# Patient Record
Sex: Male | Born: 1955 | Race: Black or African American | Hispanic: No | Marital: Single | Smoking: Current every day smoker
Health system: Southern US, Community
[De-identification: ages and names within clinical notes are randomized; demographics above are authoritative.]

## PROBLEM LIST (undated history)

## (undated) DIAGNOSIS — E785 Hyperlipidemia, unspecified: Secondary | ICD-10-CM

## (undated) HISTORY — PX: HERNIA REPAIR: SHX51

---

## 2014-08-17 ENCOUNTER — Emergency Department: Payer: Self-pay | Admitting: Emergency Medicine

## 2014-08-17 LAB — URINALYSIS, COMPLETE
BACTERIA: NONE SEEN
Bilirubin,UR: NEGATIVE
GLUCOSE, UR: NEGATIVE mg/dL (ref 0–75)
KETONE: NEGATIVE
Leukocyte Esterase: NEGATIVE
Nitrite: NEGATIVE
Ph: 5 (ref 4.5–8.0)
Protein: 30
RBC,UR: 89 /HPF (ref 0–5)
Specific Gravity: 1.027 (ref 1.003–1.030)

## 2014-08-17 LAB — CBC WITH DIFFERENTIAL/PLATELET
BASOS ABS: 0.1 10*3/uL (ref 0.0–0.1)
Basophil %: 0.6 %
Eosinophil #: 0.1 10*3/uL (ref 0.0–0.7)
Eosinophil %: 0.6 %
HCT: 44.9 % (ref 40.0–52.0)
HGB: 14.6 g/dL (ref 13.0–18.0)
LYMPHS ABS: 1.4 10*3/uL (ref 1.0–3.6)
LYMPHS PCT: 14.9 %
MCH: 30.5 pg (ref 26.0–34.0)
MCHC: 32.6 g/dL (ref 32.0–36.0)
MCV: 94 fL (ref 80–100)
Monocyte #: 0.8 x10 3/mm (ref 0.2–1.0)
Monocyte %: 8.6 %
Neutrophil #: 7.2 10*3/uL — ABNORMAL HIGH (ref 1.4–6.5)
Neutrophil %: 75.3 %
PLATELETS: 213 10*3/uL (ref 150–440)
RBC: 4.79 10*6/uL (ref 4.40–5.90)
RDW: 13.6 % (ref 11.5–14.5)
WBC: 9.5 10*3/uL (ref 3.8–10.6)

## 2014-08-17 LAB — COMPREHENSIVE METABOLIC PANEL
ALK PHOS: 71 U/L
ALT: 19 U/L
AST: 17 U/L (ref 15–37)
Albumin: 3.7 g/dL (ref 3.4–5.0)
Anion Gap: 6 — ABNORMAL LOW (ref 7–16)
BILIRUBIN TOTAL: 0.9 mg/dL (ref 0.2–1.0)
BUN: 20 mg/dL — ABNORMAL HIGH (ref 7–18)
CALCIUM: 9.1 mg/dL (ref 8.5–10.1)
CHLORIDE: 108 mmol/L — AB (ref 98–107)
CO2: 26 mmol/L (ref 21–32)
CREATININE: 1.99 mg/dL — AB (ref 0.60–1.30)
EGFR (African American): 45 — ABNORMAL LOW
EGFR (Non-African Amer.): 37 — ABNORMAL LOW
Glucose: 107 mg/dL — ABNORMAL HIGH (ref 65–99)
Osmolality: 282 (ref 275–301)
Potassium: 3.8 mmol/L (ref 3.5–5.1)
Sodium: 140 mmol/L (ref 136–145)
Total Protein: 7.3 g/dL (ref 6.4–8.2)

## 2014-08-17 LAB — LIPASE, BLOOD: Lipase: 78 U/L (ref 73–393)

## 2018-11-25 ENCOUNTER — Ambulatory Visit (INDEPENDENT_AMBULATORY_CARE_PROVIDER_SITE_OTHER): Payer: BLUE CROSS/BLUE SHIELD

## 2018-11-25 ENCOUNTER — Other Ambulatory Visit: Payer: Self-pay

## 2018-11-25 ENCOUNTER — Ambulatory Visit
Admission: EM | Admit: 2018-11-25 | Discharge: 2018-11-25 | Disposition: A | Discharge status: Home / Self Care | Payer: BLUE CROSS/BLUE SHIELD | Attending: Family Medicine | Admitting: Family Medicine

## 2018-11-25 DIAGNOSIS — M898X1 Other specified disorders of bone, shoulder: Secondary | ICD-10-CM

## 2018-11-25 MED ORDER — MELOXICAM 15 MG PO TABS: 15.0000 mg | ORAL_TABLET | Freq: Every day | ORAL | 0 refills | Status: DC | PRN
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
## 2018-11-25 NOTE — Discharge Instructions (Signed)
Xray negative for fracture.   Rest.  Ice.  Medication as needed.  Take care  Dr. Adriana Simas
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
## 2021-10-14 ENCOUNTER — Other Ambulatory Visit: Payer: Self-pay | Admitting: Gerontology

## 2021-10-14 ENCOUNTER — Other Ambulatory Visit (HOSPITAL_COMMUNITY): Payer: Self-pay | Admitting: Gerontology

## 2021-10-14 DIAGNOSIS — Z0000 Encounter for general adult medical examination without abnormal findings: Secondary | ICD-10-CM

## 2021-10-14 DIAGNOSIS — Z136 Encounter for screening for cardiovascular disorders: Secondary | ICD-10-CM

## 2021-10-21 ENCOUNTER — Ambulatory Visit
Admission: RE | Admit: 2021-10-21 | Discharge: 2021-10-21 | Disposition: A | Discharge status: Home / Self Care | Payer: BC Managed Care – PPO | Source: Ambulatory Visit | Attending: Gerontology | Admitting: Gerontology

## 2021-10-21 DIAGNOSIS — Z136 Encounter for screening for cardiovascular disorders: Secondary | ICD-10-CM | POA: Diagnosis present

## 2021-10-21 DIAGNOSIS — Z0000 Encounter for general adult medical examination without abnormal findings: Secondary | ICD-10-CM | POA: Diagnosis present

## 2022-08-31 IMAGING — US US ABDOMINAL AORTA SCREENING AAA
1 series · 14 of 25 positions shown · non-contrast
Comparison: None.

CLINICAL DATA: Abdominal aortic aneurysm screen. 65-year-old male
with significant smoking history.

EXAM:
US ABDOMINAL AORTA MEDICARE SCREENING
TECHNIQUE: Ultrasound examination of the abdominal aorta was performed as a
screening evaluation for abdominal aortic aneurysm.

[Series 1: us abdominal aorta screening aaa · 0.23mm/px · 14 of 31 slices shown]
[im 1/31]
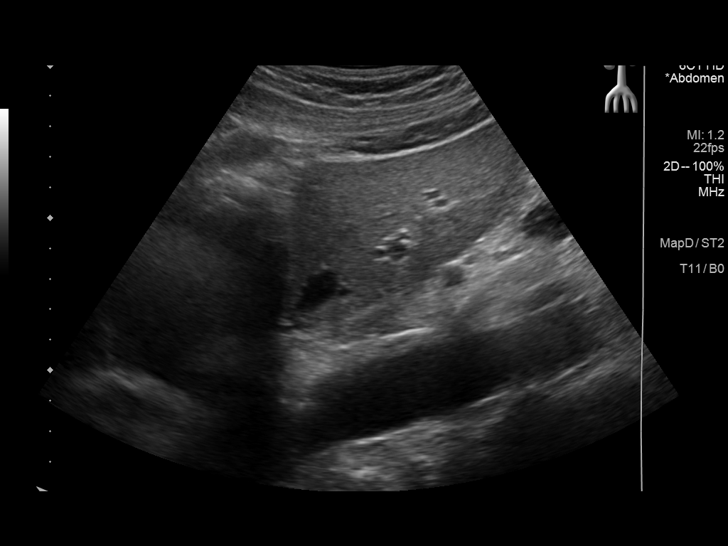
[im 3/31]
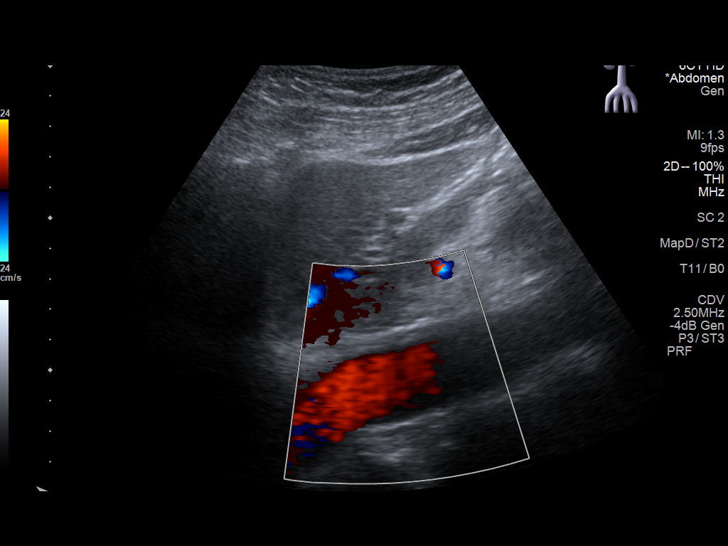
[im 6/31]
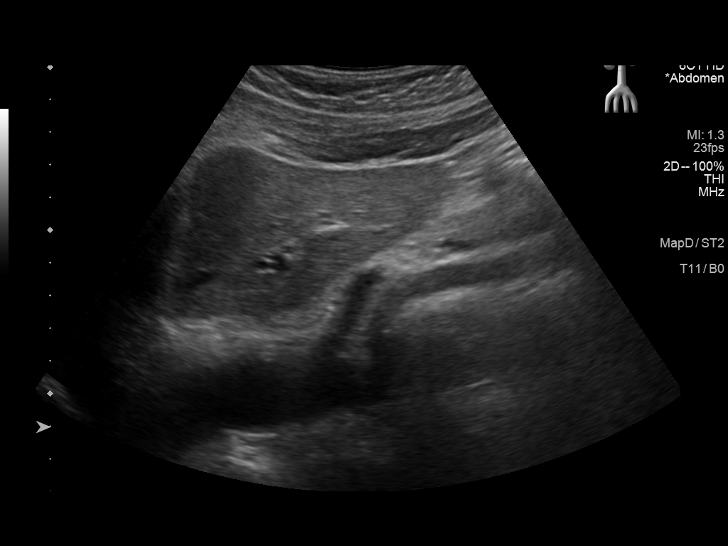
[im 8/31]
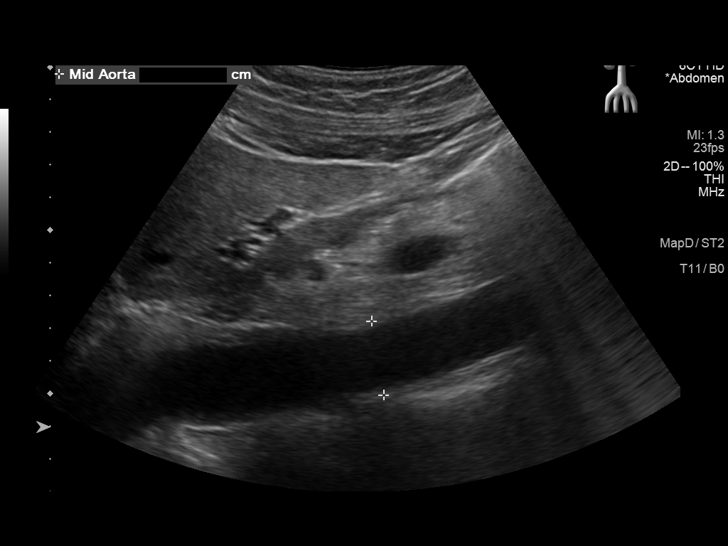
[im 11/31]
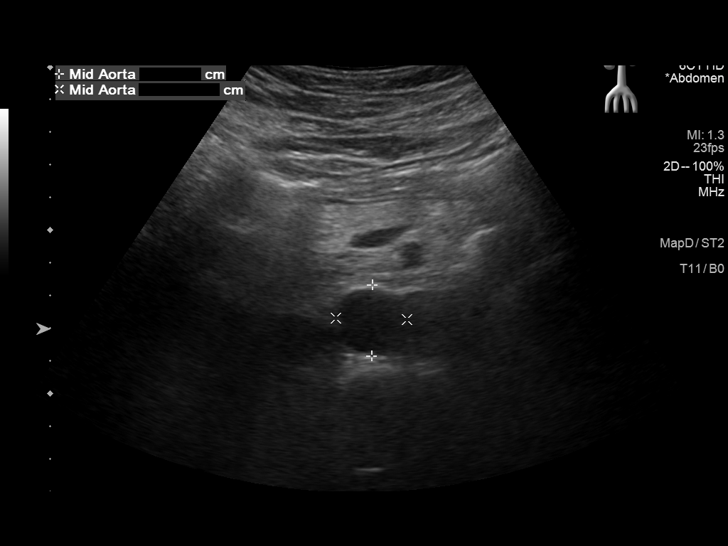
[im 12/31]
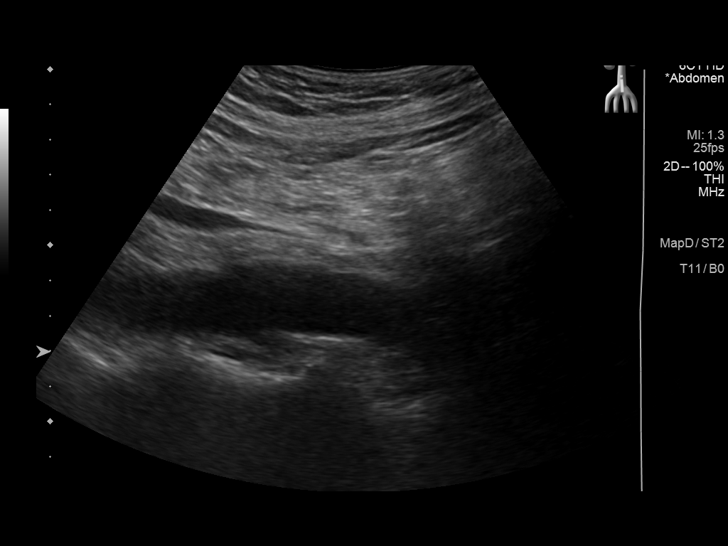
[im 14/31]
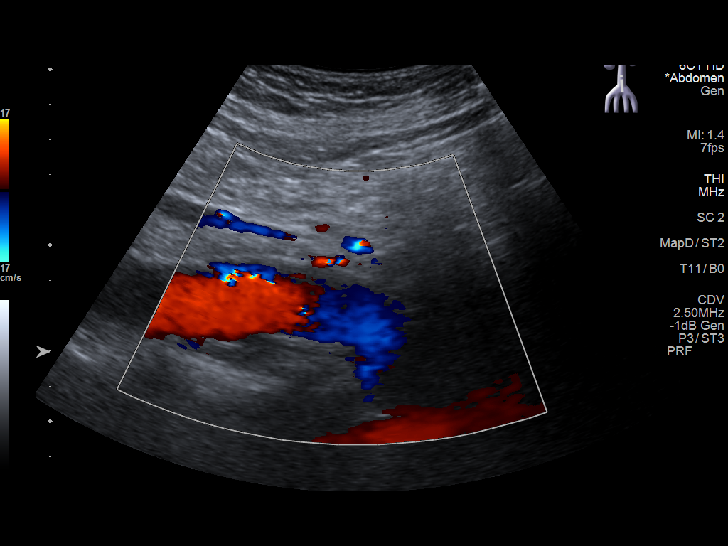
[im 17/31]
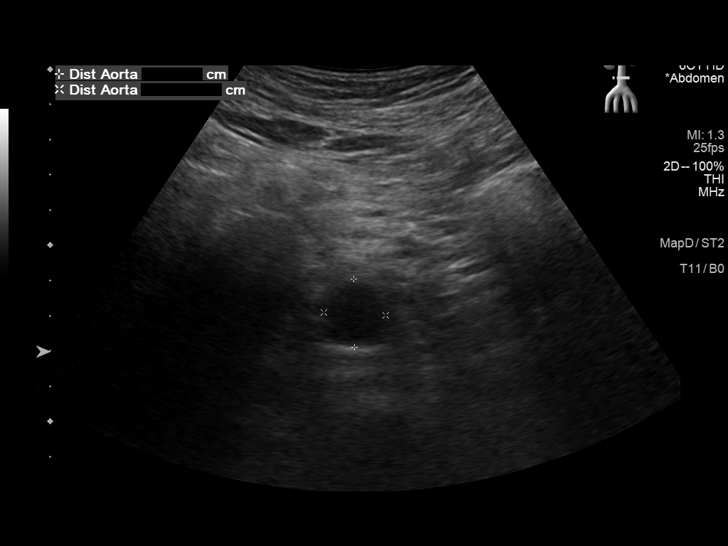
[im 19/31]
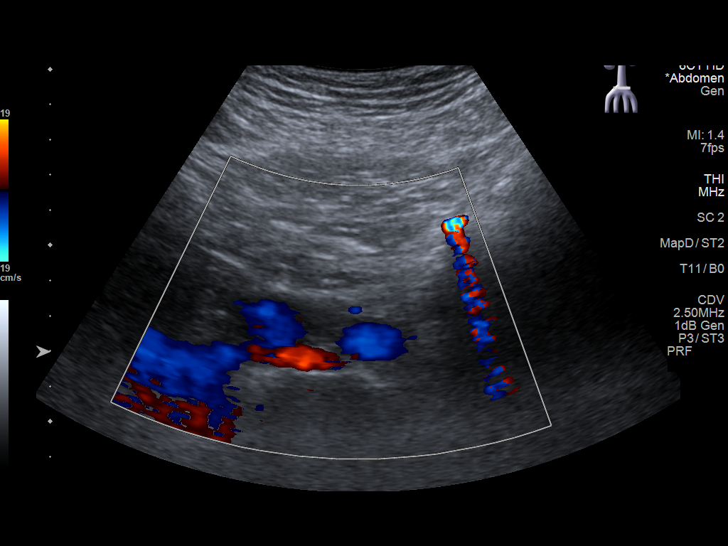
[im 21/31]
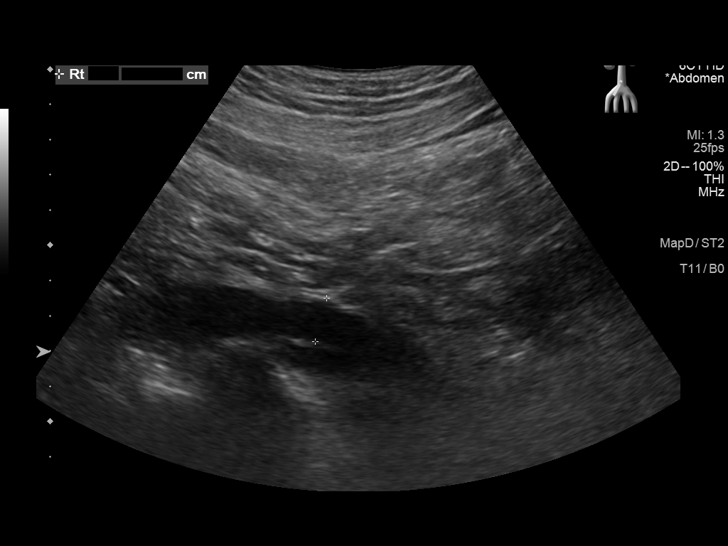
[im 23/31]
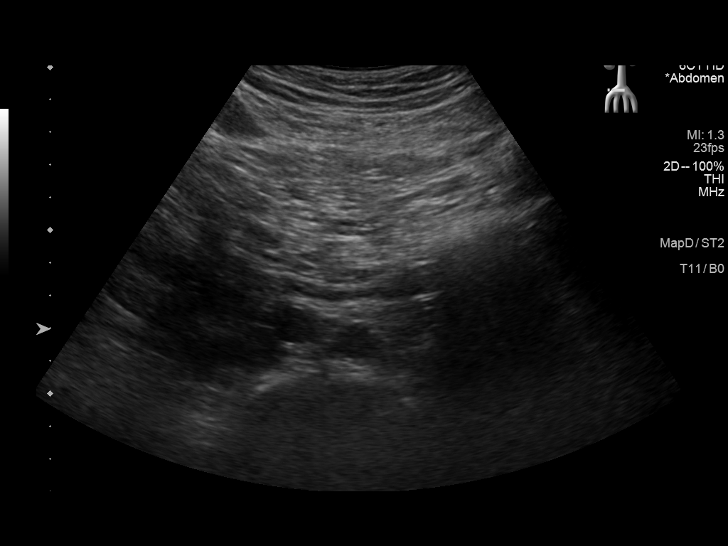
[im 26/31]
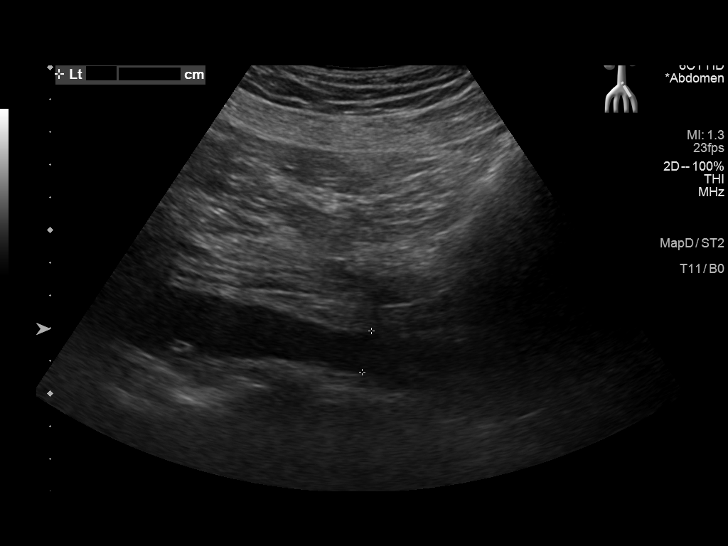
[im 28/31]
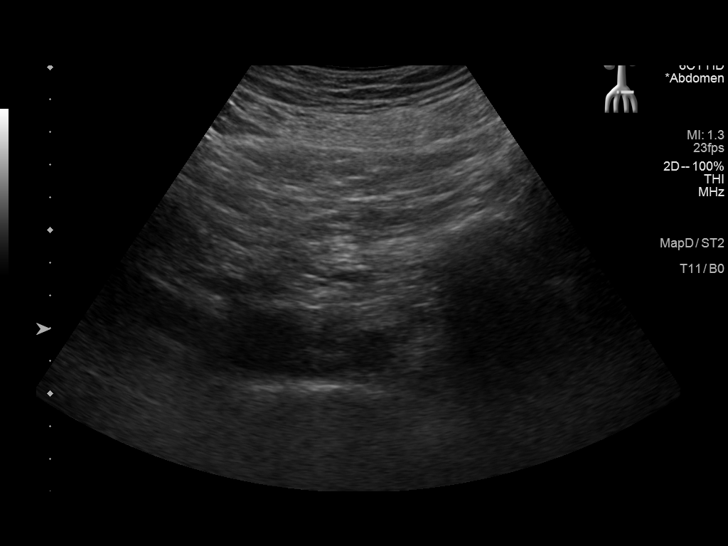
[im 31/31]
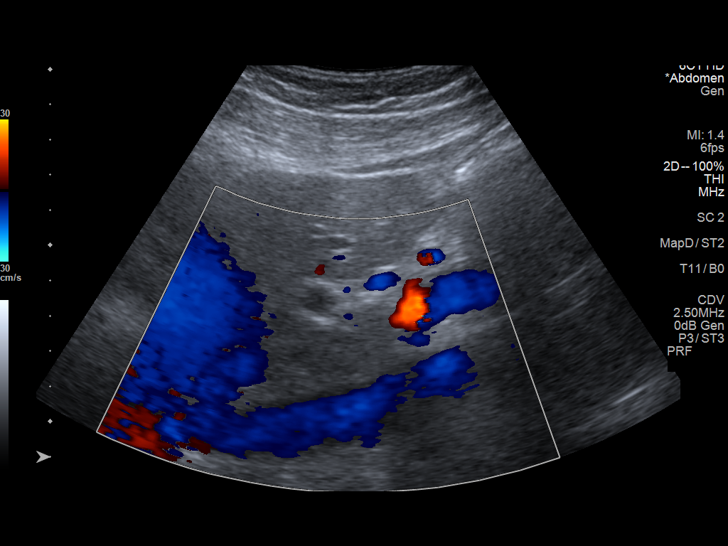

[14 of 25 positions shown; findings below may reference images not displayed]

FINDINGS: Abdominal aortic measurements as follows:

Proximal:  2.8 x 2.7 cm cm

Mid:  2.3 x 2.2 cm cm

Distal:  2.0 x 1.8 cm cm
IMPRESSION: No abdominal aortic aneurysm identified.

## 2023-12-21 ENCOUNTER — Encounter: Payer: Self-pay | Admitting: Emergency Medicine

## 2023-12-21 ENCOUNTER — Ambulatory Visit (INDEPENDENT_AMBULATORY_CARE_PROVIDER_SITE_OTHER)

## 2023-12-21 ENCOUNTER — Ambulatory Visit
Admission: EM | Admit: 2023-12-21 | Discharge: 2023-12-21 | Disposition: A | Discharge status: Home / Self Care | Attending: Emergency Medicine | Admitting: Emergency Medicine

## 2023-12-21 DIAGNOSIS — R1032 Left lower quadrant pain: Secondary | ICD-10-CM | POA: Insufficient documentation

## 2023-12-21 DIAGNOSIS — R1110 Vomiting, unspecified: Secondary | ICD-10-CM | POA: Diagnosis not present

## 2023-12-21 DIAGNOSIS — N179 Acute kidney failure, unspecified: Secondary | ICD-10-CM | POA: Diagnosis not present

## 2023-12-21 DIAGNOSIS — R109 Unspecified abdominal pain: Secondary | ICD-10-CM | POA: Diagnosis not present

## 2023-12-21 HISTORY — DX: Hyperlipidemia, unspecified: E78.5

## 2023-12-21 LAB — CBC WITH DIFFERENTIAL/PLATELET
Abs Immature Granulocytes: 0.03 10*3/uL (ref 0.00–0.07)
Basophils Absolute: 0 10*3/uL (ref 0.0–0.1)
Basophils Relative: 0 %
Eosinophils Absolute: 0 10*3/uL (ref 0.0–0.5)
Eosinophils Relative: 0 %
HCT: 44.3 % (ref 39.0–52.0)
Hemoglobin: 14.7 g/dL (ref 13.0–17.0)
Immature Granulocytes: 0 %
Lymphocytes Relative: 16 %
Lymphs Abs: 1.5 10*3/uL (ref 0.7–4.0)
MCH: 30.9 pg (ref 26.0–34.0)
MCHC: 33.2 g/dL (ref 30.0–36.0)
MCV: 93.1 fL (ref 80.0–100.0)
Monocytes Absolute: 0.8 10*3/uL (ref 0.1–1.0)
Monocytes Relative: 8 %
Neutro Abs: 7.4 10*3/uL (ref 1.7–7.7)
Neutrophils Relative %: 76 %
Platelets: 217 10*3/uL (ref 150–400)
RBC: 4.76 MIL/uL (ref 4.22–5.81)
RDW: 13.1 % (ref 11.5–15.5)
WBC: 9.8 10*3/uL (ref 4.0–10.5)
nRBC: 0 % (ref 0.0–0.2)

## 2023-12-21 LAB — URINALYSIS, W/ REFLEX TO CULTURE (INFECTION SUSPECTED)
Bacteria, UA: NONE SEEN
Bilirubin Urine: NEGATIVE
Glucose, UA: NEGATIVE mg/dL
Ketones, ur: NEGATIVE mg/dL
Leukocytes,Ua: NEGATIVE
Nitrite: NEGATIVE
Protein, ur: 30 mg/dL — AB
Specific Gravity, Urine: 1.025 (ref 1.005–1.030)
WBC, UA: NONE SEEN WBC/hpf (ref 0–5)
pH: 5 (ref 5.0–8.0)

## 2023-12-21 LAB — COMPREHENSIVE METABOLIC PANEL WITH GFR
ALT: 15 U/L (ref 0–44)
AST: 20 U/L (ref 15–41)
Albumin: 4 g/dL (ref 3.5–5.0)
Alkaline Phosphatase: 107 U/L (ref 38–126)
Anion gap: 10 (ref 5–15)
BUN: 28 mg/dL — ABNORMAL HIGH (ref 8–23)
CO2: 24 mmol/L (ref 22–32)
Calcium: 8.9 mg/dL (ref 8.9–10.3)
Chloride: 102 mmol/L (ref 98–111)
Creatinine, Ser: 2.08 mg/dL — ABNORMAL HIGH (ref 0.61–1.24)
GFR, Estimated: 34 mL/min — ABNORMAL LOW (ref >60)
Glucose, Bld: 122 mg/dL — ABNORMAL HIGH (ref 70–99)
Potassium: 4.1 mmol/L (ref 3.5–5.1)
Sodium: 136 mmol/L (ref 135–145)
Total Bilirubin: 1.1 mg/dL (ref 0.0–1.2)
Total Protein: 7.8 g/dL (ref 6.5–8.1)

## 2023-12-21 NOTE — ED Triage Notes (Signed)
 Patient presents with c/o LLQ discomfort and vomiting x 3 days. Denies diarrhea. Patient states he is taking Tylenol for the pain.

## 2023-12-21 NOTE — ED Provider Notes (Addendum)
 [facility]    CSN: [id number] Arrival date & time: 12/21/23  3086      History   Chief Complaint Chief Complaint  Patient presents with   Emesis   Abdominal Pain    HPI Stephen Le is a 68 y.o. male.   HPI  68 year old male with past medical history significant for hyperlipidemia presents for evaluation of left lower quadrant abdominal discomfort and vomiting that started 3 days ago.  He reports that the pain waxed and waned.  Yesterday his pain resolved and he returned to work, but the pain returned when he got home.  She reports tossing and turning all night because the pain was a constant and rates 2-3/10.  He believes that when his symptoms started he had a fever but he did not measure his temperature at home.  He denies any diarrhea or blood in his stool.  No history of diverticulitis or diverticulosis.  Past Medical History:  Diagnosis Date   Hyperlipidemia     There are no active problems to display for this patient.   Past Surgical History:  Procedure Laterality Date   HERNIA REPAIR         Home Medications    Prior to Admission medications   Medication Sig Start Date End Date Taking? Authorizing Provider  cyanocobalamin (VITAMIN B12) 1000 MCG tablet Take 2 tablets daily for 2 weeks, then reduce to 1 tablet daily thereafter for Vitamin B12 Deficiency. 10/14/21  Yes [provider]  simvastatin (ZOCOR) 20 MG tablet Take 20 mg by mouth daily.    [provider]    Family History No family history on file.  Social History Social History   Tobacco Use   Smoking status: Every Day    Current packs/day: 0.50    Types: Cigarettes   Smokeless tobacco: Never  Vaping Use   Vaping status: Never Used  Substance Use Topics   Alcohol use: Yes    Comment: occasionally   Drug use: Not Currently     Allergies   Patient has no known allergies.   Review of Systems Review of Systems  Constitutional:  Positive for fever.   Gastrointestinal:  Positive for abdominal pain, nausea and vomiting. Negative for blood in stool, constipation and diarrhea.  Genitourinary:  Negative for dysuria, frequency and urgency.     Physical Exam Triage Vital Signs ED Triage Vitals  Encounter Vitals Group     BP      Systolic BP Percentile      Diastolic BP Percentile      Pulse      Resp      Temp      Temp src      SpO2      Weight      Height      Head Circumference      Peak Flow      Pain Score      Pain Loc      Pain Education      Exclude from Growth Chart    No data found.  Updated Vital Signs BP (!) 154/89 (BP Location: Left Arm)   Pulse (!) 56   Temp 99.2 F (37.3 C) (Oral)   Resp 18   SpO2 94%   Visual Acuity Right Eye Distance:   Left Eye Distance:   Bilateral Distance:    Right Eye Near:   Left Eye Near:    Bilateral Near:     Physical Exam Vitals and  nursing note reviewed.  Constitutional:      Appearance: Normal appearance. He is not ill-appearing.  HENT:     Head: Normocephalic and atraumatic.  Cardiovascular:     Rate and Rhythm: Normal rate and regular rhythm.     Pulses: Normal pulses.     Heart sounds: Normal heart sounds. No murmur heard.    No friction rub. No gallop.  Pulmonary:     Effort: Pulmonary effort is normal.     Breath sounds: Normal breath sounds. No wheezing, rhonchi or rales.  Abdominal:     General: There is distension.     Palpations: Abdomen is soft.     Tenderness: There is no abdominal tenderness. There is no guarding or rebound.  Skin:    General: Skin is warm and dry.     Capillary Refill: Capillary refill takes less than 2 seconds.     Findings: No rash.  Neurological:     General: No focal deficit present.     Mental Status: He is alert and oriented to person, place, and time.      UC Treatments / Results  Labs (all labs ordered are listed, but only abnormal results are displayed) Labs Reviewed  COMPREHENSIVE METABOLIC PANEL WITH GFR  - Abnormal; Notable for the following components:      Result Value   Glucose, Bld 122 (*)    BUN 28 (*)    Creatinine, Ser 2.08 (*)    GFR, Estimated 34 (*)    All other components within normal limits  URINALYSIS, W/ REFLEX TO CULTURE (INFECTION SUSPECTED) - Abnormal; Notable for the following components:   Hgb urine dipstick LARGE (*)    Protein, ur 30 (*)    All other components within normal limits  CBC WITH DIFFERENTIAL/PLATELET    EKG   Radiology No results found.  Procedures Procedures (including critical care time)  Medications Ordered in UC Medications - No data to display  Initial Impression / Assessment and Plan / UC Course  I have reviewed the triage vital signs and the nursing notes.  Pertinent labs & imaging results that were available during my care of the patient were reviewed by me and considered in my medical decision making (see chart for details).   Patient is a pleasant, nontoxic-appearing 68 year old male presenting for evaluation of waxing waning left lower quadrant abdominal pain.  In the exam room he does not appear to be in any acute distress.  He has an elevated temp of 99.2 but otherwise his vital signs are within normal limits.  Cardiopulmonary exam reveals clear lung sounds in all fields.  Patient's abdomen looks and feels distended though he denies abdominal distention.  He is nontender to palpation.  Patient's last colonoscopy was in July 2022 and those records are not available in Care Everywhere.  I have contacted [facility] clinic gastroenterology and requested that they fax over the colonoscopy report.  The patient does not remember being told that he had diverticulosis or diverticulitis after his last colonoscopy.  Differential diagnosis include diverticulitis, colitis, constipation, gastroenteritis.  I will order a CBC and CMP to evaluate for any signs of systemic infection or bowel inflammation.  The patient also indicated that 2 days ago his urine  was darker than normal though he denies any pain with urination or urinary urgency or frequency.  I will order a urinalysis to assess for any source of infection.  Additionally, I will order a two-view abdomen to evaluate for any potential constipation  which could be contributing to the patient's symptoms.  CBC is completely unremarkable with a normal white count of 9.8.  H&H is also normal at 14.7 and 44.3 respectively.  Platelets normal at 217.  No abnormalities to differential.  CMP shows elevated glucose of 122 with a BUN of 28 and a creatinine of 2.08.  Electrolytes and transaminases are unremarkable.  Urinalysis shows large hemoglobin with 30 protein.  Negative for leukocyte esterase, nitrites, ketones, or glucose.  Reflex microscopy shows no white cells or bacteria.  6-10 red cells noted.  2 view abdomen independently reviewed and evaluated by me.  Impression: Moderate amount of stool throughout the colon with a nonspecific bowel gas pattern.  Multiple embolization coils noted throughout the pelvis.  Small calcification noted in the right inferior pelvis favored to be phlebolith.  Radiology overread is pending. Radiology impression states no acute abnormality.  Review of patient's lab work in epic shows that as of 11/08/2023 patient had normal renal function with a BUN of 13 and a creatinine of 1.1.  Given the patient's abnormal renal function and large hemoglobin in the urine I suspect that he may be experiencing an obstructing stone.  I went in and spoke with the patient regarding the lab work and he states that many years ago he did have an passed a kidney stone.  Because of the acute kidney injury I am going to refer the patient to the emergency department.  He is elected to go to [facility].   Final Clinical Impressions(s) / UC Diagnoses   Final diagnoses:  LLQ abdominal pain  Acute kidney injury (HCC)  Abdominal pain, unspecified abdominal location     Discharge  Instructions      Please go to the emergency department for further evaluation as I am concerned you may have an obstructing kidney stone given the abnormalities of your kidney function as well as the large amount of blood in your urine.  Nothing to eat or drink until after you have been evaluated in the emergency department.   ED Prescriptions   None    PDMP not reviewed this encounter.   Kent Pear, NP 12/21/23 4098    Kent Pear, NP 12/21/23 1052

## 2023-12-21 NOTE — Discharge Instructions (Addendum)
 Please go to the emergency department for further evaluation as I am concerned you may have an obstructing kidney stone given the abnormalities of your kidney function as well as the large amount of blood in your urine.  Nothing to eat or drink until after you have been evaluated in the emergency department.

## 2023-12-21 NOTE — ED Notes (Signed)
 Patient is being discharged from the Urgent Care and sent to the Emergency Department via POV . Per Kent Pear. NP, patient is in need of higher level of care due to Acute Kidney Injury, LLQ pain. Patient is aware and verbalizes understanding of plan of care.  Vitals:   12/21/23 0844  BP: (!) 154/89  Pulse: (!) 56  Resp: 18  Temp: 99.2 F (37.3 C)  SpO2: 94%

## 2023-12-24 ENCOUNTER — Ambulatory Visit (HOSPITAL_COMMUNITY): Payer: Self-pay
